# Patient Record
Sex: Male | Born: 1970 | Race: Black or African American | Hispanic: No | Marital: Single
Health system: Southern US, Community
[De-identification: ages and names within clinical notes are randomized; demographics above are authoritative.]

## PROBLEM LIST (undated history)

## (undated) HISTORY — PX: NASAL SINUS SURGERY: SHX719

---

## 2006-02-04 ENCOUNTER — Emergency Department (HOSPITAL_COMMUNITY): Admission: EM | Admit: 2006-02-04 | Discharge: 2006-02-04 | Payer: Self-pay | Admitting: Family Medicine

## 2008-01-18 ENCOUNTER — Emergency Department (HOSPITAL_COMMUNITY): Admission: EM | Admit: 2008-01-18 | Discharge: 2008-01-18 | Payer: Self-pay | Admitting: Emergency Medicine

## 2008-06-24 ENCOUNTER — Emergency Department (HOSPITAL_COMMUNITY): Admission: EM | Admit: 2008-06-24 | Discharge: 2008-06-24 | Payer: Self-pay | Admitting: Emergency Medicine

## 2008-06-26 ENCOUNTER — Emergency Department (HOSPITAL_COMMUNITY): Admission: EM | Admit: 2008-06-26 | Discharge: 2008-06-26 | Payer: Self-pay | Admitting: Emergency Medicine

## 2008-07-01 ENCOUNTER — Encounter: Admission: RE | Admit: 2008-07-01 | Discharge: 2008-07-01 | Payer: Self-pay | Admitting: Cardiology

## 2008-09-01 ENCOUNTER — Emergency Department (HOSPITAL_COMMUNITY): Admission: EM | Admit: 2008-09-01 | Discharge: 2008-09-01 | Payer: Self-pay | Admitting: Family Medicine

## 2008-09-19 ENCOUNTER — Emergency Department (HOSPITAL_COMMUNITY): Admission: EM | Admit: 2008-09-19 | Discharge: 2008-09-19 | Payer: Self-pay | Admitting: Emergency Medicine

## 2008-12-05 ENCOUNTER — Emergency Department (HOSPITAL_COMMUNITY): Admission: EM | Admit: 2008-12-05 | Discharge: 2008-12-05 | Payer: Self-pay | Admitting: Emergency Medicine

## 2008-12-12 ENCOUNTER — Emergency Department (HOSPITAL_COMMUNITY): Admission: EM | Admit: 2008-12-12 | Discharge: 2008-12-12 | Payer: Self-pay | Admitting: Emergency Medicine

## 2009-01-27 ENCOUNTER — Emergency Department (HOSPITAL_COMMUNITY): Admission: EM | Admit: 2009-01-27 | Discharge: 2009-01-27 | Payer: Self-pay | Admitting: Emergency Medicine

## 2009-02-12 ENCOUNTER — Ambulatory Visit: Payer: Self-pay | Admitting: Internal Medicine

## 2009-02-12 DIAGNOSIS — J309 Allergic rhinitis, unspecified: Secondary | ICD-10-CM | POA: Insufficient documentation

## 2009-02-28 ENCOUNTER — Telehealth: Payer: Self-pay | Admitting: Internal Medicine

## 2009-03-09 ENCOUNTER — Ambulatory Visit: Payer: Self-pay | Admitting: Internal Medicine

## 2009-03-09 DIAGNOSIS — J328 Other chronic sinusitis: Secondary | ICD-10-CM

## 2009-03-12 ENCOUNTER — Encounter: Payer: Self-pay | Admitting: Internal Medicine

## 2009-03-12 ENCOUNTER — Ambulatory Visit: Payer: Self-pay | Admitting: Internal Medicine

## 2009-08-21 ENCOUNTER — Emergency Department (HOSPITAL_COMMUNITY): Admission: EM | Admit: 2009-08-21 | Discharge: 2009-08-21 | Payer: Self-pay | Admitting: Emergency Medicine

## 2009-10-05 ENCOUNTER — Emergency Department (HOSPITAL_COMMUNITY): Admission: EM | Admit: 2009-10-05 | Discharge: 2009-10-05 | Payer: Self-pay | Admitting: Family Medicine

## 2010-03-26 IMAGING — CT CT PARANASAL SINUSES LIMITED
1 of 2 series · 16 of 25 positions shown, 20 images · non-contrast
Comparison: 07/01/2008

CLINICAL DATA: Congestion.  Fever.

CT PARANASAL SINUS LIMITED WITHOUT CONTRAST
TECHNIQUE: Multidetector CT images of the paranasal sinuses were
obtained in a single plane without contrast.

[Series 4: ltd sinus 3.0 h30s · axial · 0.29mm/px · z∈[-82,+16]mm · 16 of 24 slices shown, 20 images]
[im 2/24  brain]
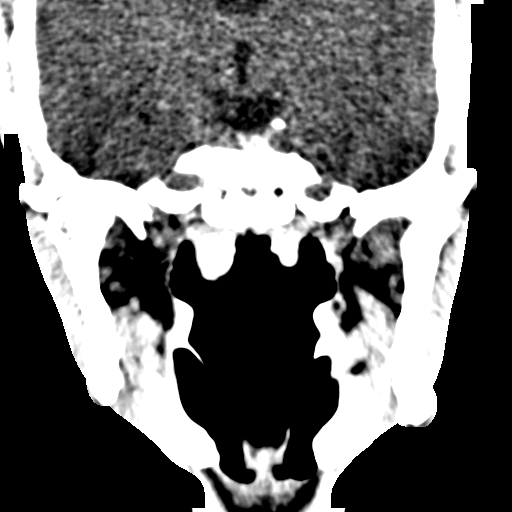
[im 2/24  bone]
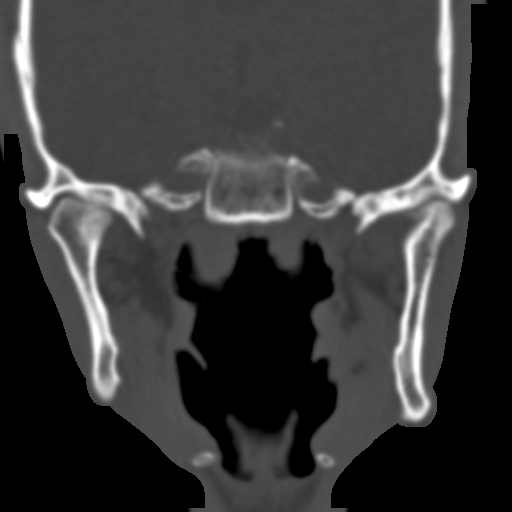
[im 4/24  bone]
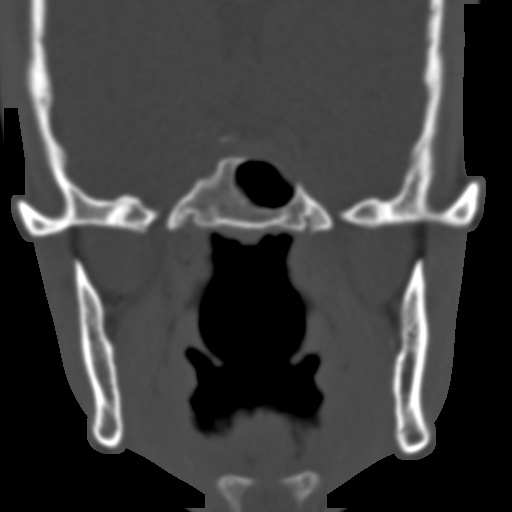
[im 5/24  bone]
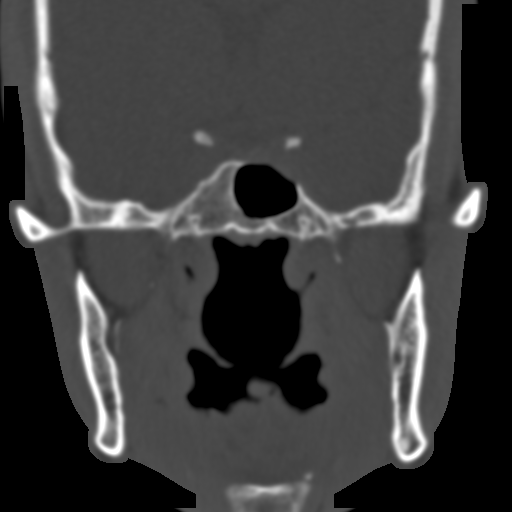
[im 6/24  bone]
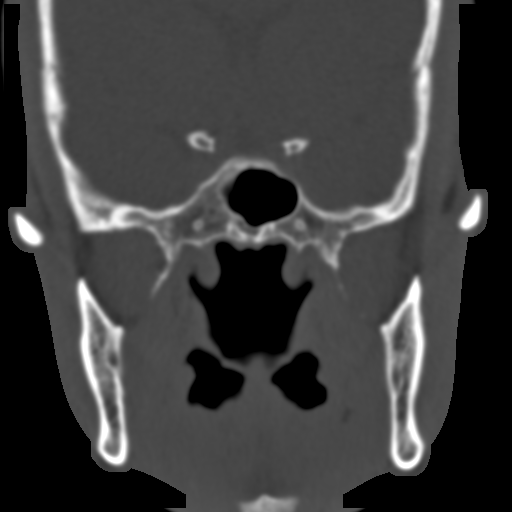
[im 8/24  brain]
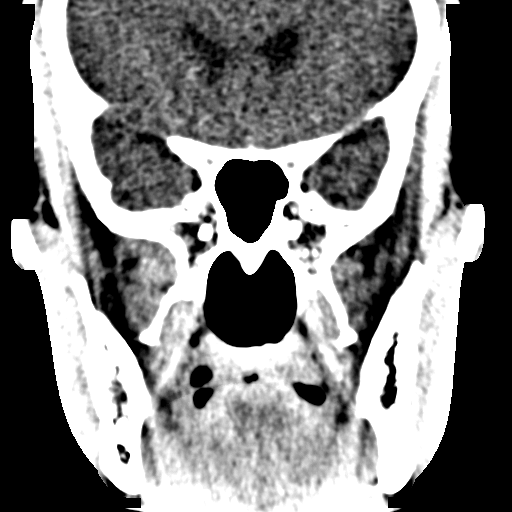
[im 8/24  bone]
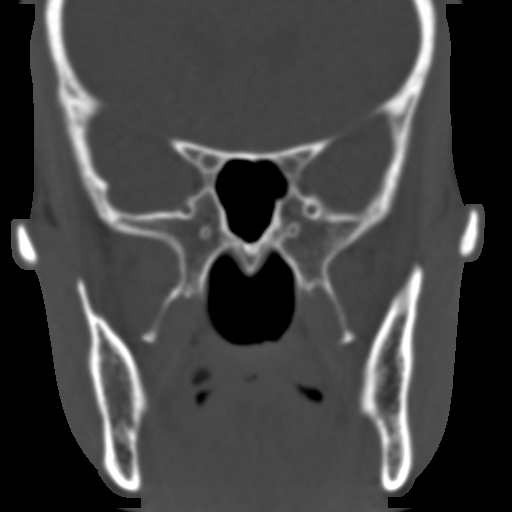
[im 9/24  bone]
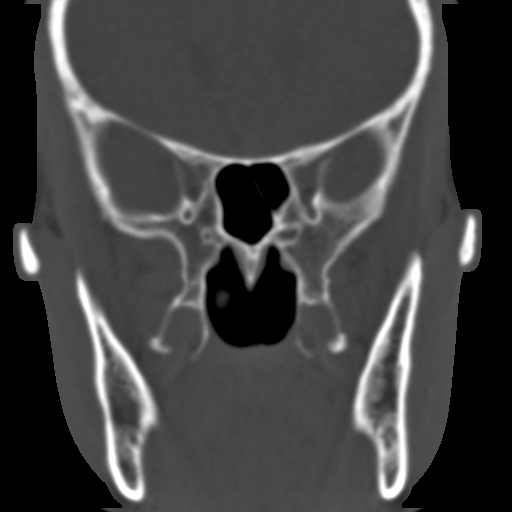
[im 10/24  bone]
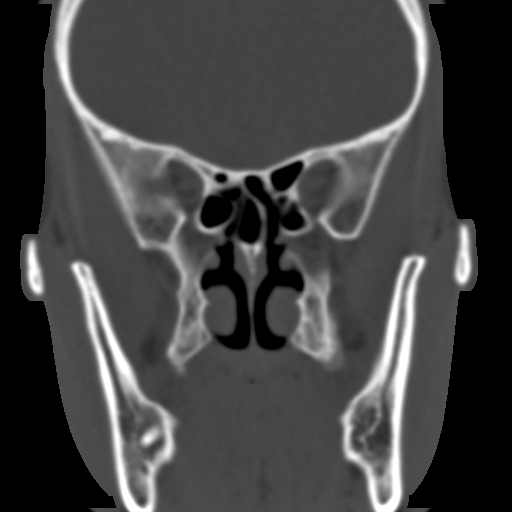
[im 12/24  bone]
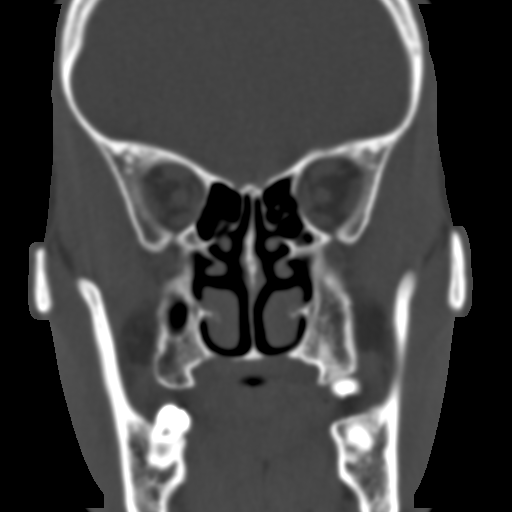
[im 13/24  brain]
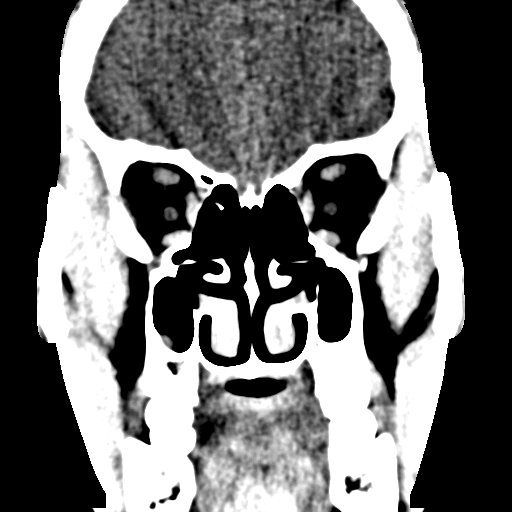
[im 13/24  bone]
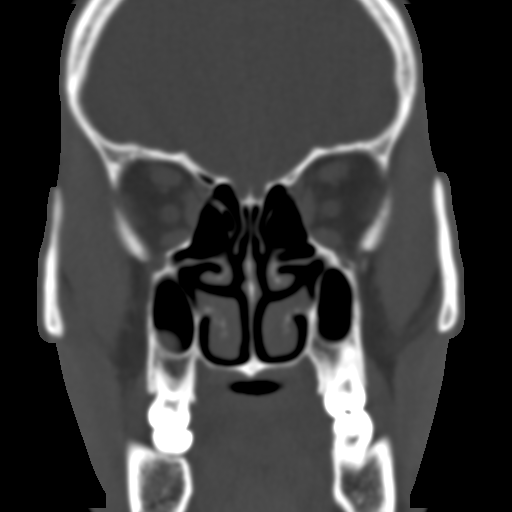
[im 15/24  bone]
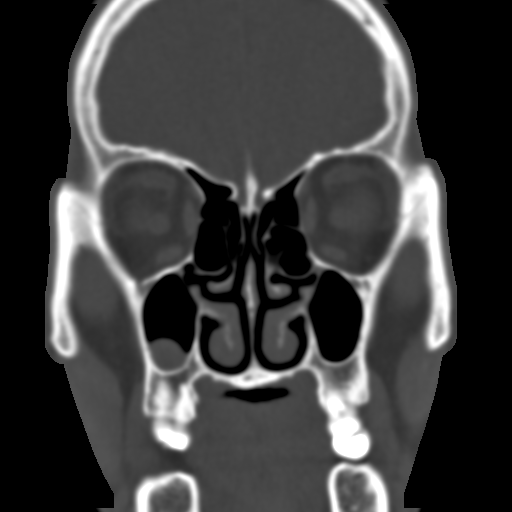
[im 16/24  bone]
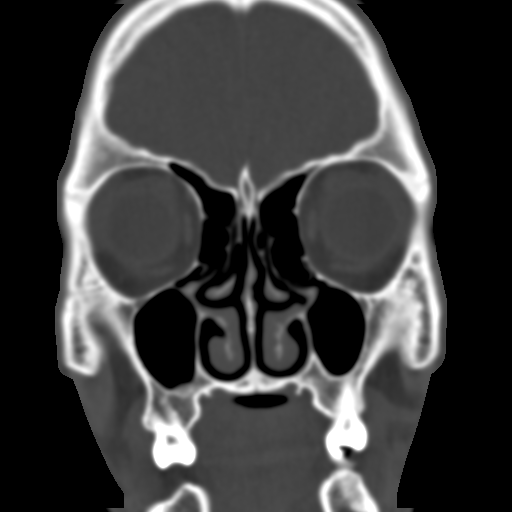
[im 17/24  bone]
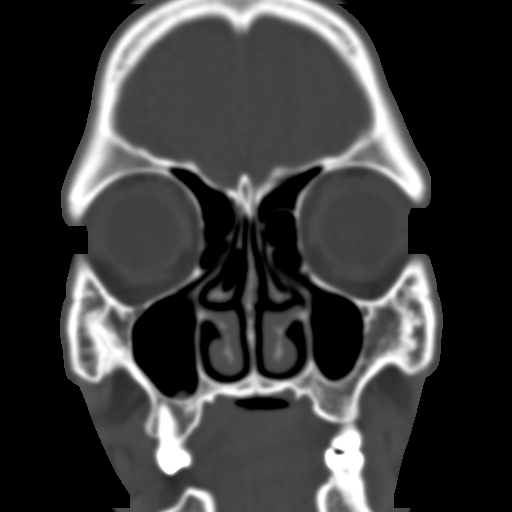
[im 19/24  brain]
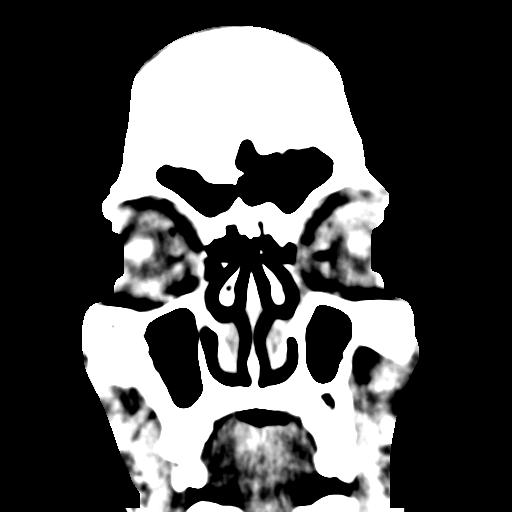
[im 19/24  bone]
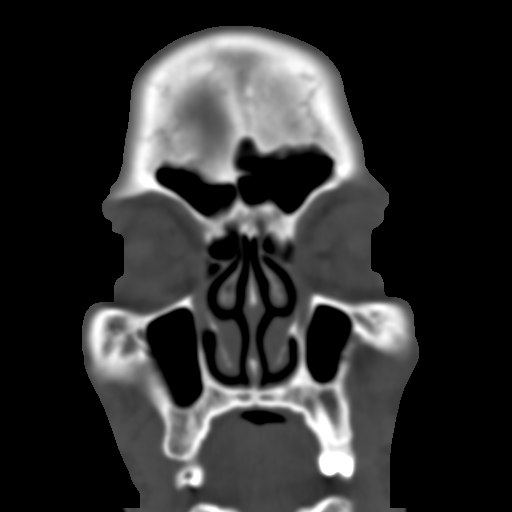
[im 20/24  bone]
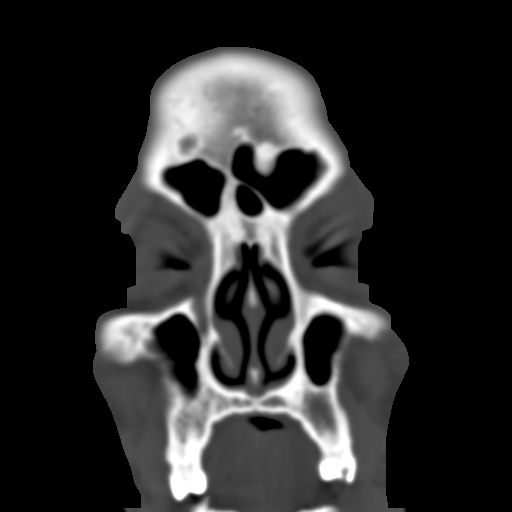
[im 21/24  bone]
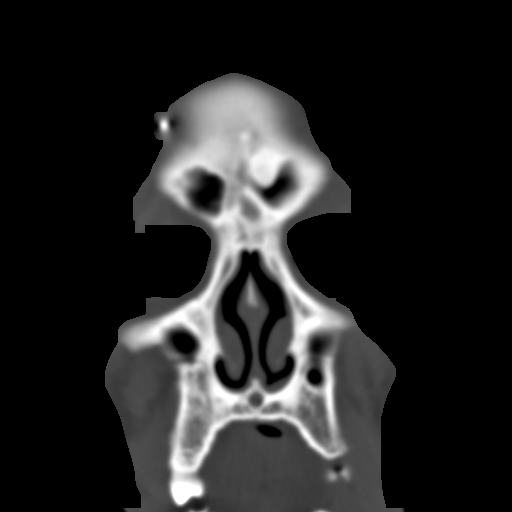
[im 23/24  bone]
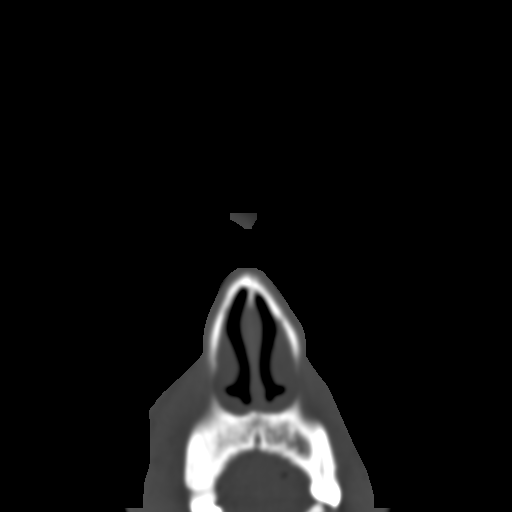

[16 of 25 positions shown; findings below may reference images not displayed]

FINDINGS: No evidence of inflammatory sinus disease.  There is an
insignificant retention cyst at the floor the right maxillary
sinus.  There is an insignificant osteoma at the left frontal
sinus.
IMPRESSION: No symptomatic finding.  See above.

## 2010-09-04 IMAGING — CR DG CHEST 2V
2 series · 2 of 2 positions shown · non-contrast
Comparison: 12/12/2008

CLINICAL DATA: Chest pain.  Shortness of breath.

CHEST - 2 VIEW

[w chest pa]
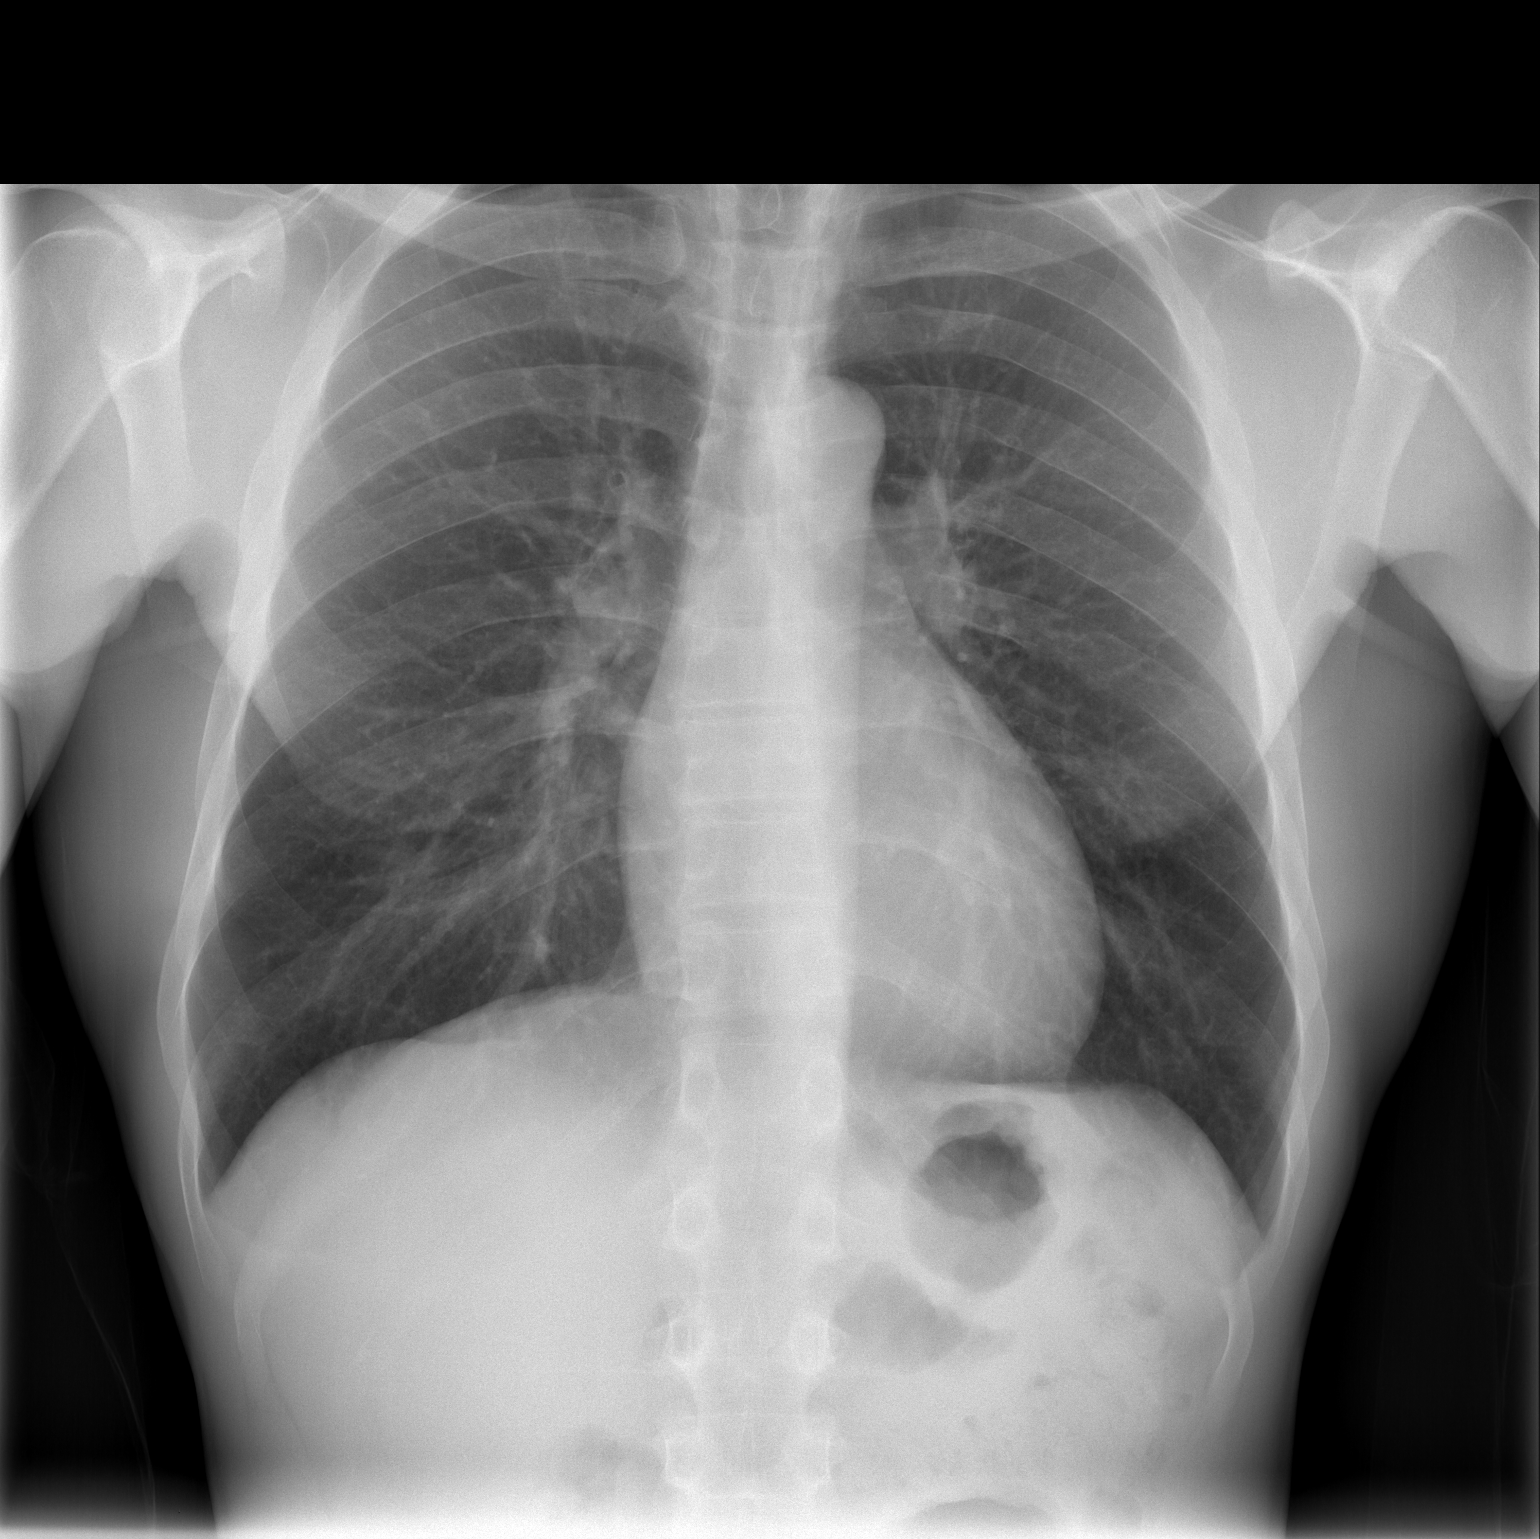

[w chest lat]
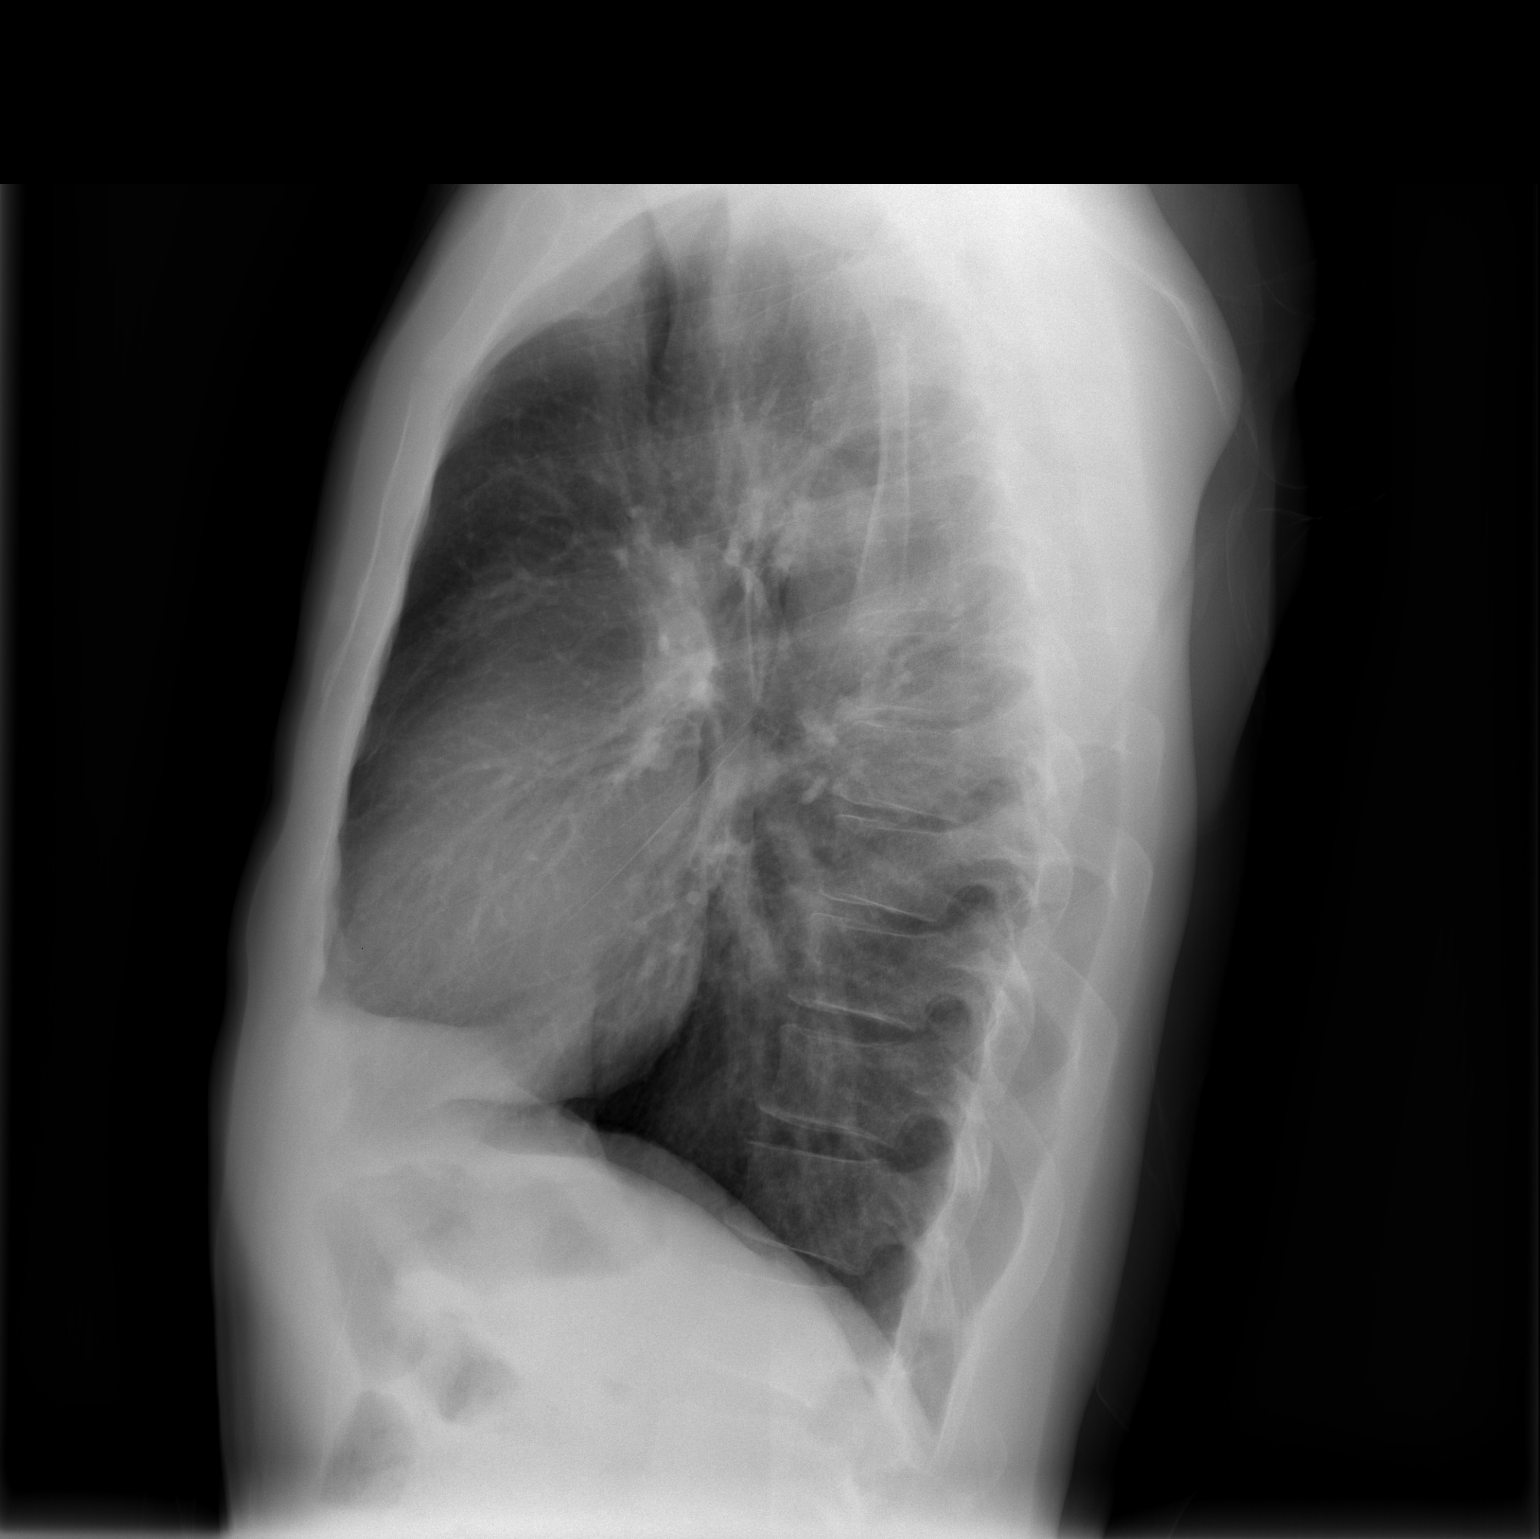

[2 of 2 positions shown; findings below may reference images not displayed]

FINDINGS: The heart size and mediastinal contours are within
normal limits.  Both lungs are clear.  The visualized skeletal
structures are unremarkable.
IMPRESSION: No active cardiopulmonary disease.

## 2011-06-27 LAB — CBC
HCT: 36.7 — ABNORMAL LOW
MCHC: 34.1
RDW: 11.6

## 2011-06-27 LAB — DIFFERENTIAL: Monocytes Absolute: 0.3

## 2011-06-28 LAB — POCT I-STAT, CHEM 8
BUN: 13
Calcium, Ion: 1.26
Chloride: 105
Creatinine, Ser: 1
Hemoglobin: 15
Sodium: 139
TCO2: 26

## 2019-03-22 ENCOUNTER — Encounter (HOSPITAL_COMMUNITY): Payer: Self-pay

## 2019-03-22 ENCOUNTER — Other Ambulatory Visit: Payer: Self-pay

## 2019-03-22 ENCOUNTER — Ambulatory Visit (HOSPITAL_COMMUNITY)
Admission: EM | Admit: 2019-03-22 | Discharge: 2019-03-22 | Disposition: A | Discharge status: Home / Self Care | Payer: BC Managed Care – PPO | Attending: Emergency Medicine | Admitting: Emergency Medicine

## 2019-03-22 DIAGNOSIS — K047 Periapical abscess without sinus: Secondary | ICD-10-CM | POA: Diagnosis not present

## 2019-03-22 MED ORDER — NAPROXEN 500 MG PO TABS
500.0000 mg | ORAL_TABLET | Freq: Two times a day (BID) | ORAL | 0 refills | Status: AC
Start: 2019-03-22 — End: ?

## 2019-03-22 MED ORDER — PENICILLIN V POTASSIUM 500 MG PO TABS: 500.0000 mg | ORAL_TABLET | Freq: Four times a day (QID) | ORAL | 0 refills | Status: AC

## 2019-03-22 NOTE — ED Triage Notes (Signed)
Pt presents with right side facial swelling from unknown source for past 2 days.

## 2019-03-22 NOTE — ED Provider Notes (Signed)
Eduardo Graham    CSN: [id number] Arrival date & time: 03/22/19  0803      History   Chief Complaint Chief Complaint  Patient presents with  . Facial Swelling    HPI Eduardo Graham is a 48 y.o. male.   Eduardo Graham presents with complaints of dental pain and swelling which started yesterday. Hx of surgery to the tooth years ago. Pain 8/10 yesterday and has improved today but increased swelling to face surrounding affected area. No drainage or bad taste in mouth. No jaw pain. No fevers. Hasn't taken any medications for pain. Doesn't follow regularly with a dentist. Without contributing medical history.      ROS per HPI, negative if not otherwise mentioned.      History reviewed. No pertinent past medical history.  Patient Active Problem List   Diagnosis Date Noted  . 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 ALLERGIC RHINITIS, CHRONIC 02/12/2009    History reviewed. No pertinent surgical history.     Home Medications    Prior to Admission medications   Medication Sig Start Date End Date Taking? Authorizing Provider  naproxen (NAPROSYN) 500 MG tablet Take 1 tablet (500 mg total) by mouth 2 (two) times daily. 03/22/19   Zigmund Gottron, NP  penicillin v potassium (VEETID) 500 MG tablet Take 1 tablet (500 mg total) by mouth 4 (four) times daily for 7 days. 03/22/19 03/29/19  Zigmund Gottron, NP    Family History Family History  Family history unknown: Yes    Social History Social History   Tobacco Use  . Smoking status: Unknown If Ever Smoked  Substance Use Topics  . Alcohol use: Not on file  . Drug use: Not on file     Allergies   Patient has no known allergies.   Review of Systems Review of Systems   Physical Exam Triage Vital Signs ED Triage Vitals [03/22/19 0820]  Enc Vitals Group     BP 137/69     Pulse Rate 80     Resp 18     Temp 98.7 F (37.1 C)     Temp Source Oral     SpO2 99 %     Weight      Height      Head  Circumference      Peak Flow      Pain Score 5     Pain Loc      Pain Edu?      Excl. in [place]?    No data found.  Updated Vital Signs BP 137/69 (BP Location: Right Arm)   Pulse 80   Temp 98.7 F (37.1 C) (Oral)   Resp 18   SpO2 99%   Visual Acuity Right Eye Distance:   Left Eye Distance:   Bilateral Distance:    Right Eye Near:   Left Eye Near:    Bilateral Near:     Physical Exam Constitutional:      Appearance: He is well-developed.  HENT:     Head:     Jaw: Tenderness and swelling present. No trismus, pain on movement or malocclusion.     Comments: Right face/cheek with visible swelling; noticed to gumline superior to tooth #7 with tenderness at jawline; no trismus  Cardiovascular:     Rate and Rhythm: Normal rate.  Pulmonary:     Effort: Pulmonary effort is normal.  Skin:    General: Skin is warm and dry.  Neurological:     Mental Status:  He is alert and oriented to person, place, and time.      UC Treatments / Results  Labs (all labs ordered are listed, but only abnormal results are displayed) Labs Reviewed - No data to display  EKG None  Radiology No results found.  Procedures Procedures (including critical care time)  Medications Ordered in UC Medications - No data to display  Initial Impression / Assessment and Plan / UC Course  I have reviewed the triage vital signs and the nursing notes.  Pertinent labs & imaging results that were available during my care of the patient were reviewed by me and considered in my medical decision making (see chart for details).     Findings consistent with dental abscess with antibiotics provided. Encouraged dental follow up. Patient verbalized understanding and agreeable to plan.   Final Clinical Impressions(s) / UC Diagnoses   Final diagnoses:  Dental abscess     Discharge Instructions     Complete course of antibiotics.  Naproxen twice a day to help with pain and swelling. Take with food.   Please follow up with your dentist for definitive treatment.   ED Prescriptions    Medication Sig Dispense Auth. Provider   penicillin v potassium (VEETID) 500 MG tablet Take 1 tablet (500 mg total) by mouth 4 (four) times daily for 7 days. 28 tablet Linus MakoBurky, Natalie B, NP   naproxen (NAPROSYN) 500 MG tablet Take 1 tablet (500 mg total) by mouth 2 (two) times daily. 30 tablet Georgetta HaberBurky, Natalie B, NP     Controlled Substance Prescriptions [place] Controlled Substance Registry consulted? Not Applicable   Georgetta HaberBurky, Natalie B, NP 03/22/19 1359

## 2019-03-22 NOTE — Discharge Instructions (Signed)
Complete course of antibiotics.  Naproxen twice a day to help with pain and swelling. Take with food.  Please follow up with your dentist for definitive treatment.

## 2023-09-21 ENCOUNTER — Ambulatory Visit (HOSPITAL_COMMUNITY)
Admission: EM | Admit: 2023-09-21 | Discharge: 2023-09-21 | Disposition: A | Discharge status: Home / Self Care | Payer: BLUE CROSS/BLUE SHIELD | Attending: Emergency Medicine | Admitting: Emergency Medicine

## 2023-09-21 ENCOUNTER — Encounter (HOSPITAL_COMMUNITY): Payer: Self-pay | Admitting: Emergency Medicine

## 2023-09-21 DIAGNOSIS — L03115 Cellulitis of right lower limb: Secondary | ICD-10-CM

## 2023-09-21 DIAGNOSIS — L2489 Irritant contact dermatitis due to other agents: Secondary | ICD-10-CM

## 2023-09-21 MED ORDER — FAMOTIDINE 40 MG PO TABS: 40.0000 mg | ORAL_TABLET | Freq: Every day | ORAL | 0 refills | Status: AC

## 2023-09-21 MED ORDER — TRIAMCINOLONE ACETONIDE 0.1 % EX CREA: 1.0000 | TOPICAL_CREAM | Freq: Two times a day (BID) | CUTANEOUS | 1 refills | Status: AC

## 2023-09-21 MED ORDER — HYDROXYZINE HCL 25 MG PO TABS: 25.0000 mg | ORAL_TABLET | Freq: Four times a day (QID) | ORAL | 0 refills | Status: AC | PRN

## 2023-09-21 MED ORDER — DOXYCYCLINE HYCLATE 100 MG PO CAPS: 100.0000 mg | ORAL_CAPSULE | Freq: Two times a day (BID) | ORAL | 0 refills | Status: AC

## 2023-09-21 MED ORDER — CETIRIZINE HCL 10 MG PO TABS: 10.0000 mg | ORAL_TABLET | Freq: Every day | ORAL | 0 refills | Status: AC

## 2023-09-21 NOTE — ED Provider Notes (Signed)
[facility]    CSN: [id number] Arrival date & time: 09/21/23  1621      History   Chief Complaint Chief Complaint  Patient presents with   Rash    HPI Eduardo Graham is a 52 y.o. male.   Patient reporting rash on the right lower leg x 1 month.  He is now having itching on bilateral arms.    The history is provided by the patient.  Rash   History reviewed. No pertinent past medical history.  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 Authorizing Provider  cetirizine (ZYRTEC) 10 MG tablet Take 1 tablet (10 mg total) by mouth daily. 09/21/23  Yes Ashtin Rosner, Linde Gillis, NP  doxycycline (VIBRAMYCIN) 100 MG capsule Take 1 capsule (100 mg total) by mouth 2 (two) times daily. 09/21/23  Yes Kylei Purington, Linde Gillis, NP  famotidine (PEPCID) 40 MG tablet Take 1 tablet (40 mg total) by mouth daily. 09/21/23  Yes Baden Betsch, Linde Gillis, NP  hydrOXYzine (ATARAX) 25 MG tablet Take 1 tablet (25 mg total) by mouth every 6 (six) hours as needed for itching. 09/21/23  Yes Lillianne Eick, Linde Gillis, NP  triamcinolone cream (KENALOG) 0.1 % Apply 1 Application topically 2 (two) times daily. Apply to bilateral arms and lower leg 09/21/23  Yes Dalvin Clipper, Linde Gillis, NP  naproxen (NAPROSYN) 500 MG tablet Take 1 tablet (500 mg total) by mouth 2 (two) times daily. 03/22/19   Georgetta Haber, NP    Family History Family History  Family history unknown: Yes    Social History Social History   Tobacco Use   Smoking status: Unknown     Allergies   Patient has no known allergies.   Review of Systems Review of Systems  Skin:  Positive for rash and wound.       Right lower outer ankle rash worse with redness and worsening dryness. Bilateral upper  inner arms and axillary rash  All other systems reviewed and are negative.    Physical Exam Triage Vital Signs ED Triage Vitals  Encounter Vitals Group     BP 09/21/23 1703 (!) 177/94     Systolic BP Percentile --      Diastolic BP Percentile --      Pulse Rate 09/21/23 1703 69     Resp 09/21/23 1703 14     Temp 09/21/23 1703 98 F (36.7 C)     Temp Source 09/21/23 1703 Oral     SpO2 09/21/23 1703 99 %     Weight --      Height --      Head Circumference --      Peak Flow --      Pain Score 09/21/23 1702 0     Pain Loc --      Pain Education --      Exclude from Growth Chart --    No data found.  Updated Vital Signs BP (!) 177/94 (BP Location: Right Arm)   Pulse 69   Temp 98 F (36.7 C) (Oral)   Resp 14   SpO2 99%   Visual Acuity Right Eye Distance:   Left Eye Distance:   Bilateral Distance:  Right Eye Near:   Left Eye Near:    Bilateral Near:     Physical Exam Skin:    Findings: Rash and wound present.     Comments: Sandpaper rash on bilateral inner arms and axilla area.   Dry excorated rash area of right lower leg ankle area with redness and warmth.        UC Treatments / Results  Labs (all labs ordered are listed, but only abnormal results are displayed) Labs Reviewed - No data to display  EKG   Radiology No results found.  Procedures Procedures (including critical care time)  Medications Ordered in UC Medications - No data to display  Initial Impression / Assessment and Plan / UC Course  I have reviewed the triage vital signs and the nursing notes.  Pertinent labs & imaging results that were available during my care of the patient were reviewed by me and considered in my medical decision making (see chart for details).   Patient has 2 areas of rash that are of concern.  The first areas bilateral inner arm areas along with the axillary areas.  Is this a fine sandpaper rash that is itchy.  He reports this started shortly after changing  deodorant types.  This does seem like a contact dermatitis in this area.  The second area of concern is his right outer lower leg and ankle area.  He states there was a contact rash from mowing the yard about a month and a half ago and the area has not healed.  But he reports that he is constantly scratching this area.  We currently have plaque scaling redness and warmth.  We will treat for cellulitis on the lower leg and contact dermatitis on the arms. Final Clinical Impressions(s) / UC Diagnoses   Final diagnoses:  Irritant contact dermatitis due to other agents  Cellulitis of leg, right     Discharge Instructions      You have a skin infection in the leg and contact dermatitis on the arms.  Stop new deodorant and go back to old deodorant.  Use antibacterial soap.   Topical ointment twice daily x 7 days. Famotidine cetirizine daily for rash. Hydroxyzine at night for itching not controlled by famotidine and cetirizine. Doxycycline 1 tablet twice a day until complete this is for the infection in the lower leg. Consider good moisturizing after shower with a body cream such as Aquaphor.  Apply the body cream after the topical ointment for itching.     ED Prescriptions     Medication Sig Dispense Auth. Provider   triamcinolone cream (KENALOG) 0.1 % Apply 1 Application topically 2 (two) times daily. Apply to bilateral arms and lower leg 80 g Alric Geise M, NP   hydrOXYzine (ATARAX) 25 MG tablet Take 1 tablet (25 mg total) by mouth every 6 (six) hours as needed for itching. 12 tablet Kairos Panetta, Linde Gillis, NP   cetirizine (ZYRTEC) 10 MG tablet Take 1 tablet (10 mg total) by mouth daily. 30 tablet Graceson Nichelson, Linde Gillis, NP   doxycycline (VIBRAMYCIN) 100 MG capsule Take 1 capsule (100 mg total) by mouth 2 (two) times daily. 20 capsule Zariah Cavendish, Linde Gillis, NP   famotidine (PEPCID) 40 MG tablet Take 1 tablet (40 mg total) by mouth daily. 10 tablet Johneric Mcfadden, Linde Gillis, NP      PDMP not reviewed this  encounter.   Nelda Marseille, NP 09/21/23 [phone]

## 2023-09-21 NOTE — ED Triage Notes (Signed)
Pt reports that was cutting grass when was warmer and got grass in shoe and started itching in right leg. Reports rash been ongoing for over month or so when cut the grass. Tried putting cream on it but causes burning.

## 2023-09-21 NOTE — Discharge Instructions (Addendum)
You have a skin infection in the leg and contact dermatitis on the arms.  Stop new deodorant and go back to old deodorant.  Use antibacterial soap.   Topical ointment twice daily x 7 days. Famotidine cetirizine daily for rash. Hydroxyzine at night for itching not controlled by famotidine and cetirizine. Doxycycline 1 tablet twice a day until complete this is for the infection in the lower leg. Consider good moisturizing after shower with a body cream such as Aquaphor.  Apply the body cream after the topical ointment for itching.

## 2023-09-28 ENCOUNTER — Telehealth (HOSPITAL_COMMUNITY): Payer: Self-pay

## 2023-09-28 NOTE — Telephone Encounter (Signed)
 Patient calling in for an extended work note. Was seen 09/21/23 and given a 2 day note. Patient informed the note can only be extended to 3 days. Patient verbalized understanding and will come by to pick up the note.  Printed.
